# Patient Record
Sex: Female | Born: 1990 | Race: White | Hispanic: No | Marital: Single | Smoking: Never smoker
Health system: Southern US, Community
[De-identification: ages and names within clinical notes are randomized; demographics above are authoritative.]

## PROBLEM LIST (undated history)

## (undated) DIAGNOSIS — B009 Herpesviral infection, unspecified: Secondary | ICD-10-CM

## (undated) DIAGNOSIS — F419 Anxiety disorder, unspecified: Secondary | ICD-10-CM

## (undated) HISTORY — DX: Anxiety disorder, unspecified: F41.9

## (undated) HISTORY — DX: Herpesviral infection, unspecified: B00.9

---

## 2008-10-20 ENCOUNTER — Emergency Department (HOSPITAL_COMMUNITY): Admission: EM | Admit: 2008-10-20 | Discharge: 2008-10-20 | Payer: Self-pay | Admitting: Emergency Medicine

## 2009-07-13 ENCOUNTER — Other Ambulatory Visit: Admission: RE | Admit: 2009-07-13 | Discharge: 2009-07-13 | Payer: Self-pay | Admitting: Gynecology

## 2009-07-13 ENCOUNTER — Ambulatory Visit: Payer: Self-pay | Admitting: Gynecology

## 2010-07-18 ENCOUNTER — Other Ambulatory Visit
Admission: RE | Admit: 2010-07-18 | Discharge: 2010-07-18 | Payer: Self-pay | Source: Home / Self Care | Admitting: Gynecology

## 2010-07-18 ENCOUNTER — Ambulatory Visit: Payer: Self-pay | Admitting: Gynecology

## 2010-11-30 LAB — POCT I-STAT, CHEM 8
Glucose, Bld: 163 mg/dL — ABNORMAL HIGH (ref 70–99)
HCT: 43 % (ref 36.0–46.0)
Hemoglobin: 14.6 g/dL (ref 12.0–15.0)
Potassium: 3.1 mEq/L — ABNORMAL LOW (ref 3.5–5.1)
Sodium: 139 mEq/L (ref 135–145)
TCO2: 23 mmol/L (ref 0–100)

## 2010-11-30 LAB — RAPID URINE DRUG SCREEN, HOSP PERFORMED
Opiates: NOT DETECTED
Tetrahydrocannabinol: NOT DETECTED

## 2010-12-27 ENCOUNTER — Ambulatory Visit (INDEPENDENT_AMBULATORY_CARE_PROVIDER_SITE_OTHER): Payer: BC Managed Care – PPO | Admitting: Gynecology

## 2010-12-27 DIAGNOSIS — B373 Candidiasis of vulva and vagina: Secondary | ICD-10-CM

## 2010-12-27 DIAGNOSIS — N898 Other specified noninflammatory disorders of vagina: Secondary | ICD-10-CM

## 2011-07-03 ENCOUNTER — Telehealth: Payer: Self-pay | Admitting: *Deleted

## 2011-07-03 NOTE — Telephone Encounter (Signed)
As long as pregnancy not possible I would recommend stopping the pills now have a withdrawal period and then restart a new pack this coming Sunday and see how she does with this. If her cramping or bleeding continues she will need an office visit.

## 2011-07-03 NOTE — Telephone Encounter (Signed)
Pt informed with the below note and will try this. If bleeding cont. She will make office visit.

## 2011-07-03 NOTE — Telephone Encounter (Signed)
Pt called stating that her seasonique was doing well until the last 2 weeks. Pt says she has had spotting for two weeks followed by cramping now, she takes pill at the same time everyday, doesn't miss any pills. She does take Advil to help with cramps her period is due to start in the next 2 weeks. She has been taking pills since nov.2011. No chance of pregnancy.  Pt wanted me to relay this message to you. Please advise.

## 2011-07-11 ENCOUNTER — Encounter: Payer: Self-pay | Admitting: *Deleted

## 2011-07-11 DIAGNOSIS — B009 Herpesviral infection, unspecified: Secondary | ICD-10-CM | POA: Insufficient documentation

## 2011-07-20 ENCOUNTER — Encounter: Payer: Self-pay | Admitting: Gynecology

## 2011-07-20 ENCOUNTER — Ambulatory Visit (INDEPENDENT_AMBULATORY_CARE_PROVIDER_SITE_OTHER): Payer: BC Managed Care – PPO | Admitting: Gynecology

## 2011-07-20 VITALS — BP 110/70 | Ht 66.0 in | Wt 145.0 lb

## 2011-07-20 DIAGNOSIS — R823 Hemoglobinuria: Secondary | ICD-10-CM

## 2011-07-20 DIAGNOSIS — Z113 Encounter for screening for infections with a predominantly sexual mode of transmission: Secondary | ICD-10-CM

## 2011-07-20 DIAGNOSIS — Z01419 Encounter for gynecological examination (general) (routine) without abnormal findings: Secondary | ICD-10-CM

## 2011-07-20 DIAGNOSIS — Z131 Encounter for screening for diabetes mellitus: Secondary | ICD-10-CM

## 2011-07-20 DIAGNOSIS — Z13220 Encounter for screening for lipoid disorders: Secondary | ICD-10-CM

## 2011-07-20 DIAGNOSIS — N938 Other specified abnormal uterine and vaginal bleeding: Secondary | ICD-10-CM

## 2011-07-20 LAB — HEPATITIS C ANTIBODY: HCV Ab: NEGATIVE

## 2011-07-20 LAB — HIV ANTIBODY (ROUTINE TESTING W REFLEX): HIV: NONREACTIVE

## 2011-07-20 MED ORDER — NORETHINDRONE ACET-ETHINYL EST 1-20 MG-MCG PO TABS: 1.0000 | ORAL_TABLET | Freq: Every day | ORAL | Status: DC

## 2011-07-20 NOTE — Patient Instructions (Signed)
Recommend Gardasil vaccine series. Follow up for her annual exam in one year

## 2011-07-20 NOTE — Progress Notes (Signed)
Tasha Wagner 1991-02-07 [id number]        20 y.o.  for annual exam.  She does note spotting on and off for the last several weeks which is unusual for her. She is on [employer]. She asked about switching to something different.  Past medical history,surgical history, medications, allergies, family history and social history were all reviewed and documented in the EPIC chart. ROS:  Was performed and pertinent positives and negatives are included in the history.  Exam: chaperone present Filed Vitals:   07/20/11 0904  BP: 110/70   General appearance  Normal Skin grossly normal Head/Neck normal with no cervical or supraclavicular adenopathy thyroid normal Lungs  clear Cardiac RR, without RMG Abdominal  soft, nontender, without masses, organomegaly or hernia Breasts  examined lying and sitting without masses, retractions, discharge or axillary adenopathy. Pelvic  Mons pubis with large mole right side  Ext/BUS/vagina  normal   Cervix  normal  GC Chlamydia screen done  Uterus  anteverted, normal size, shape and contour, midline and mobile nontender   Adnexa  Without masses or tenderness    Anus and perineum  normal     Assessment/Plan:  20 y.o. female for annual exam.    1. DUB on BCPs. Will check qualitative hCG for completeness. She wants to switch to different pills I prescribed Loestrin 120 equivalence and we reviewed every other withdrawal. Op and labeling issues also discussed. Go ahead and try to see how she does back up contraception regardless help decrease STD transmission. [place] is go [place] I wrote a separate prescription for her in the event she would lose her pills to have available as well as I wrote note for the pharmacy to allow her to get six packs at a time to take with her. 2. Mons pubis mole.  Patient has large a mole on her mons pubis that she has always had that has remained unchanged does not bother her she'll continue to watch this. 3. STD  screening. She requests STD screening. She has no known exposures. GC Chlamydia hepatitis B hepatitis C HIV RPR ordered. 4. Gardasil vaccine. She has not received the vaccine and I again recommended that she received the series and she acknowledges my recommendation. 5. Pap smear. Patient has to annual Pap smears and road are normal. A discussed current screening guidelines and recommend an every 3 year Pap smear and she agrees with this. A Pap was done today as her last Pap smear was 2011. 6. Breast health. SBE monthly basis discussed adverse. 7. Health maintenance. CBC urinalysis lipid profile glucose ordered along with her STD blood work and qualitative hCG.    Dara Lords MD, 10:06 AM 07/20/2011

## 2011-07-21 LAB — RPR

## 2011-07-23 ENCOUNTER — Telehealth: Payer: Self-pay

## 2011-07-23 NOTE — Telephone Encounter (Signed)
PT. NOTIFIED OF DR. TF'S NOTE BELOW.

## 2011-07-23 NOTE — Telephone Encounter (Signed)
If irregular bleeding continues through tomorrow, recommend OV

## 2011-07-23 NOTE — Telephone Encounter (Signed)
AFTER SEEING YOU 07-20-11 FOR HER AEX. SHE FINISHED HER OLD OCP'S THIS WEEKEND AND WAS DUE FOR HER REGULAR PERIOD 07-22-11. STATES SHE STARTED BLEEDING FIVE DAYS EARLIER THAN SHE IS SUPPOSED TO. SINCE 07-23-11 PERIOD IS VERY HEAVY WITH CLOTS RANGING FROM SIZE OF DIMES TO CLOSE TO GOLF BALL SIZED CLOTS. NO OTHER SYMPTOMS STATED. I  ADVISED HER TO STAY OFF OF HER FEET UNTIL I RECEIVED AN ANSWER FROM YOU. DOES SHE NEED TO BE RECHECKED THIS P.M.?

## 2011-07-24 ENCOUNTER — Encounter: Payer: Self-pay | Admitting: Gynecology

## 2011-07-24 ENCOUNTER — Ambulatory Visit (INDEPENDENT_AMBULATORY_CARE_PROVIDER_SITE_OTHER): Payer: BC Managed Care – PPO | Admitting: Gynecology

## 2011-07-24 VITALS — BP 120/70

## 2011-07-24 DIAGNOSIS — N938 Other specified abnormal uterine and vaginal bleeding: Secondary | ICD-10-CM

## 2011-07-24 DIAGNOSIS — N949 Unspecified condition associated with female genital organs and menstrual cycle: Secondary | ICD-10-CM

## 2011-07-24 NOTE — Progress Notes (Signed)
Patient presents complaining of heavy vaginal bleeding. She was seen last week for her annual prescription some pills of her Solmon Ice started having breakthrough bleeding finished out her pack and this was her. Week but she has continued to bleed briskly heavier now she is passing clots changing her pad every hour or so. Her hemoglobin was good last week and she a negative hCG.  Exam Abdomen soft nontender without masses guarding rebound Pelvic external BUS vagina with moderate menses flow. Cervix normal. Uterus normal size midline mobile. Adnexa without masses or tenderness  Assessment and plan: Dysfunctional bleeding. I gave her a sample pack of Generess and asked her to start these twice a day for the next several days until her bleeding minimizes and then once a day to finish out the pack. She'll then start her Loestrin 120 equivalents. I did discuss other

## 2011-07-24 NOTE — Patient Instructions (Signed)
Take birth control sample pills Generess twice daily until bleeding is scant then once daily for the remainder of the pack. Then start the prescribed birth control pills as we previously discussed.

## 2011-07-27 ENCOUNTER — Other Ambulatory Visit: Payer: Self-pay | Admitting: Gynecology

## 2012-02-07 ENCOUNTER — Encounter (HOSPITAL_COMMUNITY): Payer: Self-pay | Admitting: Emergency Medicine

## 2012-02-07 ENCOUNTER — Emergency Department (HOSPITAL_COMMUNITY)
Admission: EM | Admit: 2012-02-07 | Discharge: 2012-02-07 | Disposition: A | Discharge status: Home / Self Care | Payer: BC Managed Care – PPO | Attending: Emergency Medicine | Admitting: Emergency Medicine

## 2012-02-07 ENCOUNTER — Emergency Department (HOSPITAL_COMMUNITY): Payer: BC Managed Care – PPO

## 2012-02-07 DIAGNOSIS — S060X9A Concussion with loss of consciousness of unspecified duration, initial encounter: Secondary | ICD-10-CM

## 2012-02-07 DIAGNOSIS — IMO0002 Reserved for concepts with insufficient information to code with codable children: Secondary | ICD-10-CM | POA: Insufficient documentation

## 2012-02-07 DIAGNOSIS — Z833 Family history of diabetes mellitus: Secondary | ICD-10-CM | POA: Insufficient documentation

## 2012-02-07 DIAGNOSIS — S060X0A Concussion without loss of consciousness, initial encounter: Secondary | ICD-10-CM | POA: Insufficient documentation

## 2012-02-07 DIAGNOSIS — Z8249 Family history of ischemic heart disease and other diseases of the circulatory system: Secondary | ICD-10-CM | POA: Insufficient documentation

## 2012-02-07 MED ORDER — NAPROXEN 500 MG PO TABS: 500.0000 mg | ORAL_TABLET | Freq: Two times a day (BID) | ORAL | Status: DC

## 2012-02-07 NOTE — ED Notes (Signed)
Pt denies pain or blurred vision.  Pupils equal.  CT results are back and Dr Hyacinth Meeker is aware.

## 2012-02-07 NOTE — Discharge Instructions (Signed)
Your CT scan was normal, please see the reading instructions regarding concussion. You may use Naprosyn for headaches  Head injury  You have had a head injury which does not appear to require admission at this time. A concussion is a status changed mental ability because of trauma.  Seek immediate medical attention if:   There is confusion or drowsiness  You cannot awaken the injured portion  (Although children frequently become drowsy after injury)  There is nausea or continued, forceful vomiting  You notice dizziness or unsteadiness which is getting worse, or inability to walk  You have convulsions or unconsciousness  You experience a severe, persistent headaches not relieved by Tylenol. (Do not take aspirin as this in pairs clotting abilities). Take other pain medications only as directed  You cannot use arms or legs normally  There are changes in pupil size of the eye  There is clear or bloody discharge from the nose or ears  Change in speech, vision, swallowing or understanding.  Localized weakness, numbness, tingling or change in bowel or bladder control   Please followup with your doctor in the next 2 days if still having symptoms. If you do not have a family doctor, see the list of followup contact information below.  RESOURCE GUIDE  Dental Problems  Patients with Medicaid: [facility] [phone] [address]                                           [phone] [address] Phone:  [phone]                                                  Phone:  [phone]  If unable to pay or uninsured, contact:  [facility] or [facility]. to become qualified for the adult dental clinic.  Chronic Pain Problems Contact [facility]  [phone] Patients need to be referred by their primary care doctor.  Insufficient Money for Medicine Contact [employer]:  call "211" or [facility]  [phone] [phone].  No Primary Care Doctor Call [facility]  [phone] Other agencies that provide inexpensive medical care    [facility]  [phone]    [facility]  [phone]    [facility]  [phone]    [facility]  [phone]    [facility]  [phone]    [facility] Child [facility]  [phone]  Psychological Services [facility] Behavioral Health  [phone] [facility] Services  [phone] [facility] Mental Health   [phone] (emergency services [phone])  Substance Abuse Resources [facility]  [phone] [facility] [phone] [facility] [place] [phone] Floydene Flock [phone] Residential & Outpatient Substance Abuse Program  [phone]  Abuse/Neglect [facility] Child Abuse Hotline 463-104-7881 [facility] Child Abuse Hotline [phone] (After Hours)  Emergency Shelter [facility] [phone]  Maternity Homes [facility] [place] [facility] [place] [phone] [facility] [phone]  MRSA Hotline #:   939-554-0665    [facility]  [place] Dept. [address]      [place] Phone:  [phone]                                   Phone:  [phone]                 Phone:  [place] Phone:  [place] [phone] (After Hours)

## 2012-02-07 NOTE — ED Provider Notes (Signed)
History     CSN: [id number]  Arrival date & time 02/07/12  4098   First MD Initiated Contact with Patient 02/07/12 [id number]      Chief Complaint  Patient presents with  . Blurred Vision    (Consider location/radiation/quality/duration/timing/severity/associated sxs/prior treatment) HPI Comments: 21 year old female with no significant past medical history presents with a complaint of blurred vision. She states that approximately one week ago she was walking backwards and struck the posterior occiput of her head on a trailer on the corner. Ever since that time she has had intermittent blurred vision. The vision is worse in the morning when she awakens, gradually improved throughout the day and is normal most of the time. She was having intermittent headaches until yesterday and has not had any since that time. She denies nausea vomiting fever or stiff neck difficulty breathing, chest pain, weakness numbness or ataxia. Currently she does not have any blurred vision. She does wear contact lenses and glasses.  The history is provided by the patient and a friend.    Past Medical History  Diagnosis Date  . HSV infection     Fever blisters    History reviewed. No pertinent past surgical history.  Family History  Problem Relation Age of Onset  . Diabetes Maternal Grandfather   . Heart disease Paternal Grandmother   . Heart disease Paternal Grandfather     History  Substance Use Topics  . Smoking status: Never Smoker   . Smokeless tobacco: Never Used  . Alcohol Use: 1.0 oz/week    2 drink(s) per week    OB History    Grav Para Term Preterm Abortions TAB SAB Ect Mult Living   0               Review of Systems  All other systems reviewed and are negative.    Allergies  Penicillins  Home Medications   Current Outpatient Rx  Name Route Sig Dispense Refill  . CAMRESE 0.15-0.03 &0.01 MG PO TABS  TAKE AS DIRECTED 91 tablet 2  . ONE-DAILY MULTI VITAMINS PO TABS Oral Take 1  tablet by mouth daily.      Marland Kitchen NAPROXEN 500 MG PO TABS Oral Take 1 tablet (500 mg total) by mouth 2 (two) times daily with a meal. 30 tablet 0    BP 114/83  Pulse 70  Temp 97.8 F (36.6 C) (Oral)  Resp 18  SpO2 97%  LMP 02/07/2012  Physical Exam  Nursing note and vitals reviewed. Constitutional: She appears well-developed and well-nourished. No distress.  HENT:  Head: Normocephalic and atraumatic.  Mouth/Throat: Oropharynx is clear and moist. No oropharyngeal exudate.  Eyes: Conjunctivae and EOM are normal. Pupils are equal, round, and reactive to light. Right eye exhibits no discharge. Left eye exhibits no discharge. No scleral icterus.  Neck: Normal range of motion. Neck supple. No JVD present. No thyromegaly present.  Cardiovascular: Normal rate, regular rhythm, normal heart sounds and intact distal pulses.  Exam reveals no gallop and no friction rub.   No murmur heard. Pulmonary/Chest: Effort normal and breath sounds normal. No respiratory distress. She has no wheezes. She has no rales.  Abdominal: Soft. Bowel sounds are normal. She exhibits no distension and no mass. There is no tenderness.  Musculoskeletal: Normal range of motion. She exhibits no edema and no tenderness.  Lymphadenopathy:    She has no cervical adenopathy.  Neurological: She is alert. Coordination normal.       Neurologic exam:  Speech clear,  pupils equal round reactive to light, extraocular movements intact  Normal peripheral visual fields Cranial nerves III through XII normal including no facial droop Follows commands, moves all extremities x4, normal strength to bilateral upper and lower extremities at all major muscle groups including grip Sensation normal to light touch and pinprick Coordination intact, no limb ataxia, finger-nose-finger normal Rapid alternating movements normal No pronator drift Gait normal   Skin: Skin is warm and dry. No rash noted. No erythema.  Psychiatric: She has a normal  mood and affect. Her behavior is normal.    ED Course  Procedures (including critical care time)   Labs Reviewed  POCT PREGNANCY, URINE   Ct Head Wo Contrast  02/07/2012  *RADIOLOGY REPORT*  Clinical Data: Blurred vision; unequal pupil size.  Hit head last week.  CT HEAD WITHOUT CONTRAST  Technique:  Contiguous axial images were obtained from the base of the skull through the vertex without contrast.  Comparison: None.  Findings: There is no evidence of acute infarction, mass lesion, or intra- or extra-axial hemorrhage on CT.  The posterior fossa, including the cerebellum, brainstem and fourth ventricle, is within normal limits.  The third and lateral ventricles, and basal ganglia are unremarkable in appearance.  The cerebral hemispheres are symmetric in appearance, with normal gray- white differentiation.  No mass effect or midline shift is seen.  There is no evidence of fracture; visualized osseous structures are unremarkable in appearance.  The visualized portions of the orbits are within normal limits.  The paranasal sinuses and mastoid air cells are well-aerated.  No significant soft tissue abnormalities are seen.  IMPRESSION: No evidence of traumatic intracranial injury or fracture.  Original Report Authenticated By: Tonia Ghent, M.D.     1. Concussion       MDM  No signs of head trauma, no hematoma laceration abrasion. She has normal pupillary exam, normal retinal exam and has a normal neurologic exam. Her vital signs are normal, she has persistent increased blurred vision throughout the last several days of there is a temporal pattern to it. I suspect this is a concussion, p[atient insistent on CT scan, we'll order to rule out intracranial hemorrhage.  CT negative, pt informed,  Discharge Prescriptions include:  naprosyn     Vida Roller, MD 02/07/12 [phone]

## 2012-02-07 NOTE — ED Notes (Signed)
PT states hit R occiput last Sat.  Was told by pcp to come to ED if s/s persist.  Pt continued to experience intermittent blurred vision, and intermittent moments when L pupil is slightly smaller than L.  Denies nv, but c/o cervical soreness.  Pupils equal and reactive presently.

## 2012-02-07 NOTE — ED Notes (Signed)
PT. REPORTS HIT HER HEAD AGAINST CAMPER LAST Saturday , STATES LEFT EYE BLURRED VISION " FEELS WEIRD" , HEADACHE AND BACK OF NECK " STIFF", ALSO NOTED LEFT PUPIL SMALLER THAN RIGHT. SEEN BY HER EYE MD YESTERDAY ADVISED TO GO TO ER WHEN SYMPTOMS PERSIST.

## 2012-03-07 ENCOUNTER — Ambulatory Visit (INDEPENDENT_AMBULATORY_CARE_PROVIDER_SITE_OTHER): Payer: BC Managed Care – PPO | Admitting: Emergency Medicine

## 2012-03-07 VITALS — BP 116/76 | HR 74 | Temp 98.7°F | Resp 16 | Ht 66.0 in | Wt 152.2 lb

## 2012-03-07 DIAGNOSIS — A09 Infectious gastroenteritis and colitis, unspecified: Secondary | ICD-10-CM

## 2012-03-07 DIAGNOSIS — K5900 Constipation, unspecified: Secondary | ICD-10-CM

## 2012-03-07 LAB — POCT CBC
Lymph, poc: 2.9 (ref 0.6–3.4)
MCH, POC: 30.8 pg (ref 27–31.2)
MCHC: 30.8 g/dL — AB (ref 31.8–35.4)
MCV: 99.9 fL — AB (ref 80–97)
MID (cbc): 0.6 (ref 0–0.9)
MPV: 9.1 fL (ref 0–99.8)
POC LYMPH PERCENT: 40.7 %L (ref 10–50)
POC MID %: 8.5 %M (ref 0–12)
Platelet Count, POC: 332 10*3/uL (ref 142–424)
RBC: 4.48 M/uL (ref 4.04–5.48)
RDW, POC: 12.8 %
WBC: 7.1 10*3/uL (ref 4.6–10.2)

## 2012-03-07 NOTE — Progress Notes (Signed)
  Subjective:    Patient ID: Tasha Wagner, female    DOB: 1990-12-31, 21 y.o.   MRN: [id number]  HPI Comments: Spent 5 months in [place] and has green mucoid and occasionally blood tinged diarrhea twice weekly.  Tends to be constipated and has not experienced a formed stool since her return/  Diarrhea  This is a recurrent problem. The current episode started more than 1 month ago. The problem occurs less than 2 times per day. The problem has been unchanged. The stool consistency is described as bloody and mucous. The patient states that diarrhea does not awaken her from sleep. Associated symptoms include chills. Pertinent negatives include no abdominal pain, arthralgias, bloating, coughing, fever, headaches, increased  flatus, myalgias, sweats, URI, vomiting or weight loss.      Review of Systems  Constitutional: Positive for chills. Negative for fever and weight loss.  HENT: Negative.   Eyes: Negative.   Respiratory: Negative.  Negative for cough.   Cardiovascular: Negative.   Gastrointestinal: Positive for diarrhea and blood in stool. Negative for nausea, vomiting, abdominal pain, abdominal distention, anal bleeding, rectal pain, bloating and flatus.  Genitourinary: Negative.   Musculoskeletal: Negative for myalgias and arthralgias.  Skin: Negative.   Neurological: Negative for headaches.       Objective:   Physical Exam  Constitutional: She appears well-developed and well-nourished.  HENT:  Head: Normocephalic.  Eyes: Conjunctivae are normal. Pupils are equal, round, and reactive to light.  Neck: Normal range of motion. Neck supple.  Cardiovascular: Normal rate.   Pulmonary/Chest: Effort normal and breath sounds normal.  Abdominal: Soft. There is tenderness.          Assessment & Plan:  Likely infectious diarrhea Cultures, O&P Hydration Follow up after labs

## 2012-03-12 ENCOUNTER — Encounter: Payer: Self-pay | Admitting: Emergency Medicine

## 2012-03-12 LAB — OVA AND PARASITE SCREEN: OP: NONE SEEN

## 2012-03-15 LAB — STOOL CULTURE

## 2012-07-30 ENCOUNTER — Encounter: Payer: Self-pay | Admitting: Gynecology

## 2012-07-30 ENCOUNTER — Ambulatory Visit (INDEPENDENT_AMBULATORY_CARE_PROVIDER_SITE_OTHER): Payer: BC Managed Care – PPO | Admitting: Gynecology

## 2012-07-30 VITALS — BP 120/68 | Ht 66.0 in | Wt 149.0 lb

## 2012-07-30 DIAGNOSIS — Z01419 Encounter for gynecological examination (general) (routine) without abnormal findings: Secondary | ICD-10-CM

## 2012-07-30 DIAGNOSIS — Z309 Encounter for contraceptive management, unspecified: Secondary | ICD-10-CM

## 2012-07-30 MED ORDER — DROSPIRENONE-ETHINYL ESTRADIOL 3-0.02 MG PO TABS: 1.0000 | ORAL_TABLET | Freq: Every day | ORAL | Status: DC

## 2012-07-30 NOTE — Progress Notes (Signed)
Tasha Wagner Dec 06, 1990 [id number]        21 y.o.  G0P0 for annual exam.  Wants to talk about changing her birth control pill  Past medical history,surgical history, medications, allergies, family history and social history were all reviewed and documented in the EPIC chart. ROS:  Was performed and pertinent positives and negatives are included in the history.  Exam: Sherrilyn Rist assistant Filed Vitals:   07/30/12 1608  BP: 120/68  Height: 5\' 6"  (1.676 m)  Weight: 149 lb (67.586 kg)   General appearance  Normal Skin grossly normal Head/Neck normal with no cervical or supraclavicular adenopathy thyroid normal Lungs  clear Cardiac RR, without RMG Abdominal  soft, nontender, without masses, organomegaly or hernia Breasts  examined lying and sitting without masses, retractions, discharge or axillary adenopathy. Pelvic  Ext/BUS/vagina  normal with light menses. Large benign-appearing mole right mons  Cervix  normal with light menses  Uterus  anteverted, normal size, shape and contour, midline and mobile nontender   Adnexa  Without masses or tenderness    Anus and perineum  normal       Assessment/Plan:  21 y.o. G0P0 female for annual exam.   1. Patient notes more moodiness with her BCPs. Wants to change to a different type. We reviewed various options and ultimately she wants to try Yaz. I reviewed possible increased thrombosis risks such as stroke heart attack DVT and she understands and accepts. I prescribed her x1 year we'll see how she does with this. 2. Breast health. SBE monthly reviewed. 3. Mole right mons. Present for years unchanged. Patient will continue to monitor. 4. Pap smear 2011 . No Pap smear done today.  No history of abnormal Pap smears. Plan Pap smear next year a 3 year interval. 5. Gardasil. I again encouraged her to consider the Gardasil series. Patient declined at this time. 6. STD screening. Offered and declined. 7. Health maintenance. Had normal CBC lipid profile  glucose last year. No changes in symptoms area will check urinalysis this year only. Follow up one year, sooner as needed    Dara Lords MD, 4:37 PM 07/30/2012

## 2012-07-30 NOTE — Patient Instructions (Signed)
Trying a birth control pill. Call me if you have any issues.

## 2012-07-31 LAB — URINALYSIS W MICROSCOPIC + REFLEX CULTURE
Bacteria, UA: NONE SEEN
Bilirubin Urine: NEGATIVE
Glucose, UA: NEGATIVE mg/dL
Ketones, ur: NEGATIVE mg/dL
Protein, ur: NEGATIVE mg/dL
RBC / HPF: >50 RBC/hpf — AB (ref <3)
Urobilinogen, UA: 0.2 mg/dL (ref 0.0–1.0)

## 2012-08-03 LAB — URINE CULTURE

## 2012-08-04 ENCOUNTER — Other Ambulatory Visit: Payer: Self-pay | Admitting: *Deleted

## 2012-08-04 DIAGNOSIS — R319 Hematuria, unspecified: Secondary | ICD-10-CM

## 2012-08-08 ENCOUNTER — Other Ambulatory Visit: Payer: BC Managed Care – PPO

## 2012-08-08 DIAGNOSIS — R319 Hematuria, unspecified: Secondary | ICD-10-CM

## 2012-08-09 LAB — URINALYSIS W MICROSCOPIC + REFLEX CULTURE
Casts: NONE SEEN
Glucose, UA: NEGATIVE mg/dL
Hgb urine dipstick: NEGATIVE
Ketones, ur: NEGATIVE mg/dL
Leukocytes, UA: NEGATIVE
Nitrite: NEGATIVE
Protein, ur: NEGATIVE mg/dL
pH: 5.5 (ref 5.0–8.0)

## 2012-08-10 LAB — URINE CULTURE
Colony Count: NO GROWTH
Organism ID, Bacteria: NO GROWTH

## 2012-08-30 ENCOUNTER — Other Ambulatory Visit: Payer: Self-pay | Admitting: Gynecology

## 2012-09-26 ENCOUNTER — Other Ambulatory Visit: Payer: Self-pay | Admitting: *Deleted

## 2012-09-26 MED ORDER — DROSPIRENONE-ETHINYL ESTRADIOL 3-0.02 MG PO TABS: 1.0000 | ORAL_TABLET | Freq: Every day | ORAL | Status: DC

## 2012-09-26 NOTE — Telephone Encounter (Signed)
90 day rx requests

## 2012-11-19 IMAGING — CT CT HEAD W/O CM
1 series · 16 of 30 positions shown, 20 images · non-contrast
Comparison: None.

CLINICAL DATA: Blurred vision; unequal pupil size.  Hit head last
week.

CT HEAD WITHOUT CONTRAST
TECHNIQUE: Contiguous axial images were obtained from the base of
the skull through the vertex without contrast.

[Series 2: head trauma 4.8 h37s · axial · 0.43mm/px · z∈[+1272,+1403]mm · 16 of 30 slices shown, 20 images]
[im 2/30  brain]
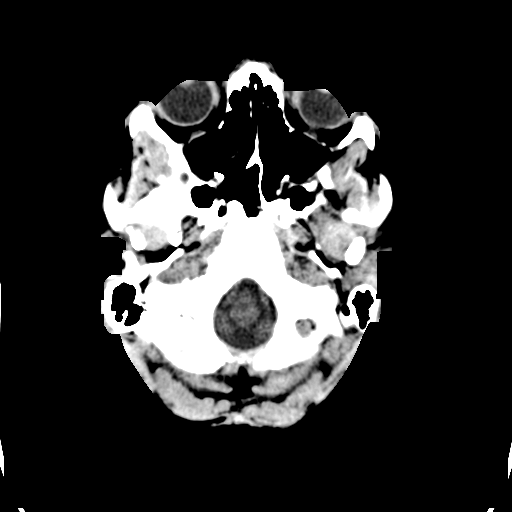
[im 2/30  bone]
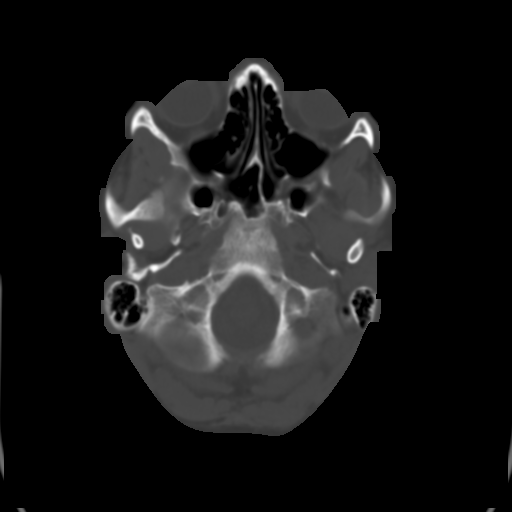
[im 4/30  brain]
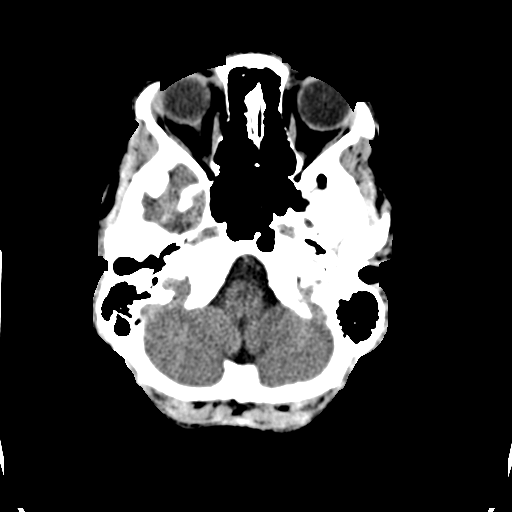
[im 6/30  brain]
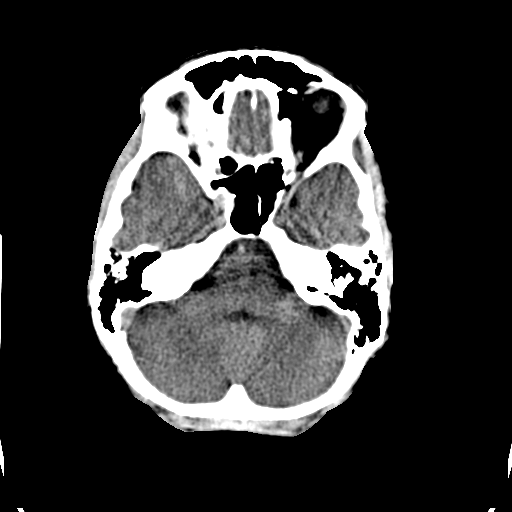
[im 8/30  brain]
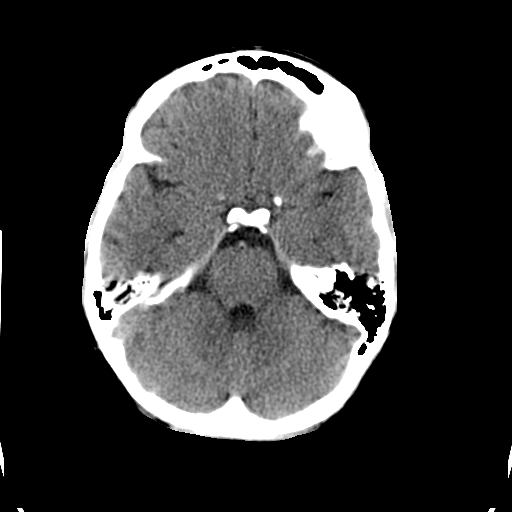
[im 9/30  brain]
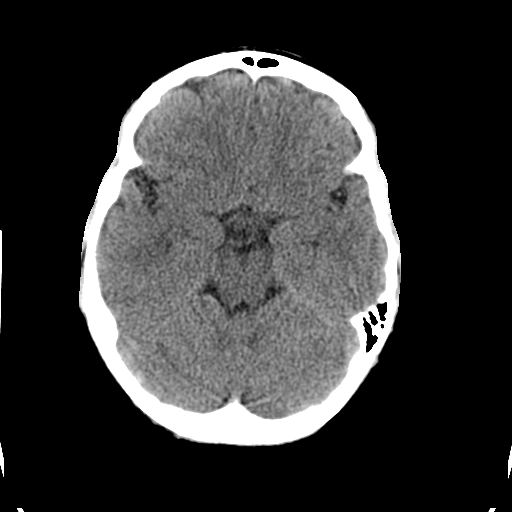
[im 9/30  bone]
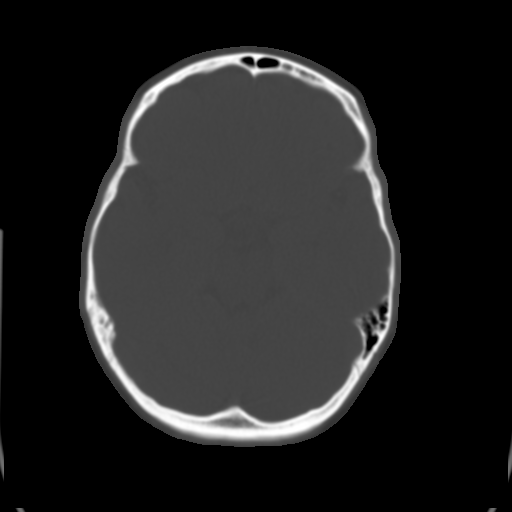
[im 11/30  brain]
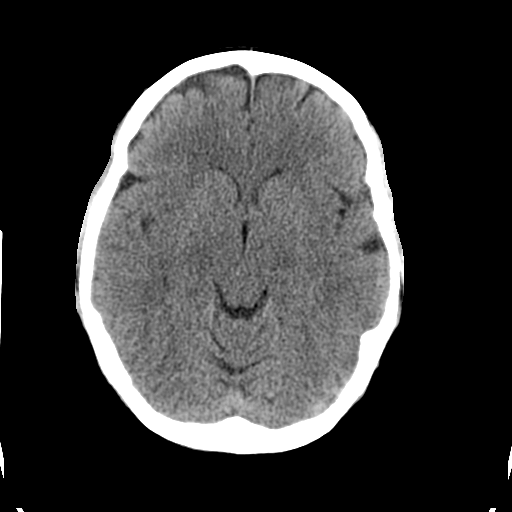
[im 13/30  brain]
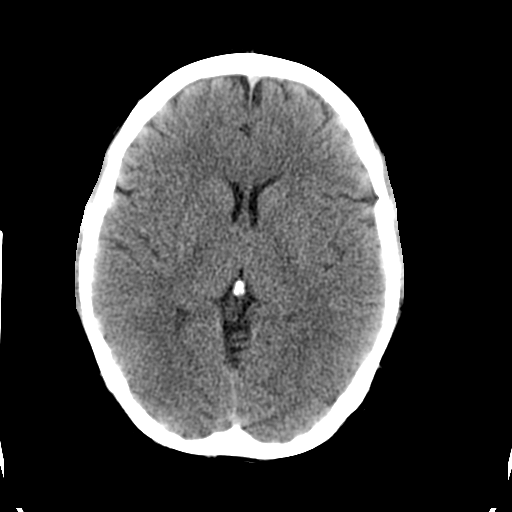
[im 15/30  brain]
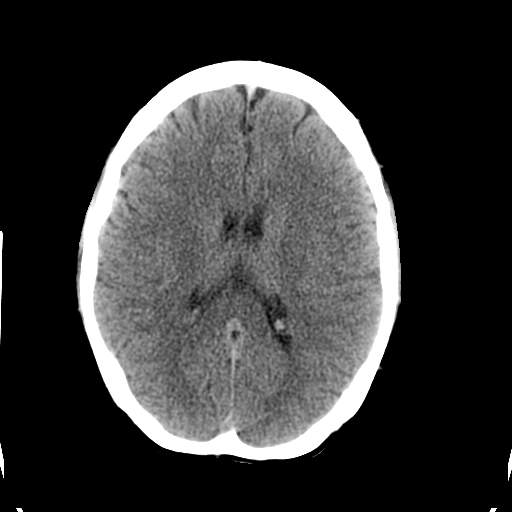
[im 16/30  brain]
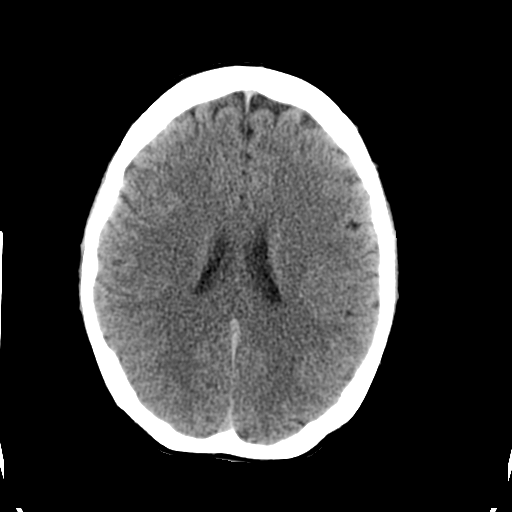
[im 16/30  bone]
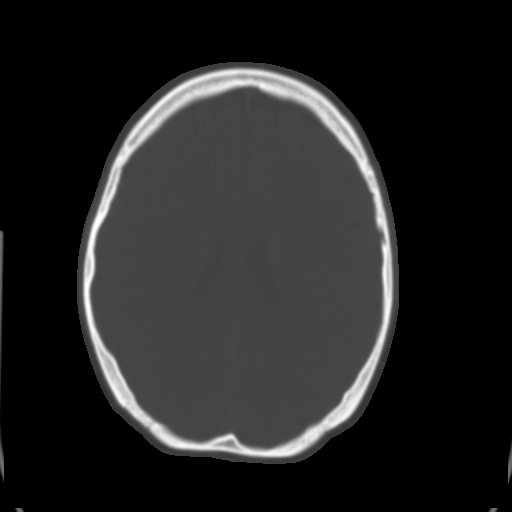
[im 18/30  brain]
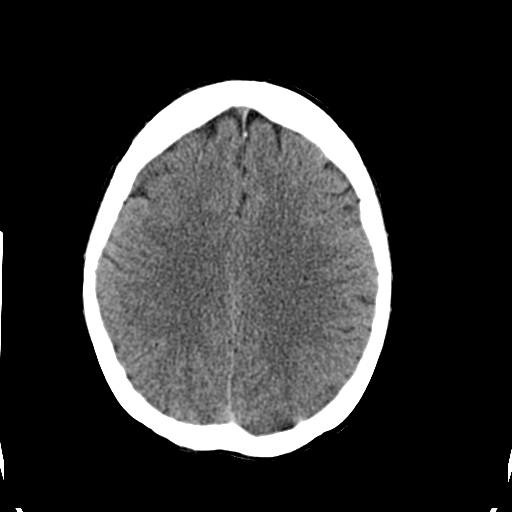
[im 20/30  brain]
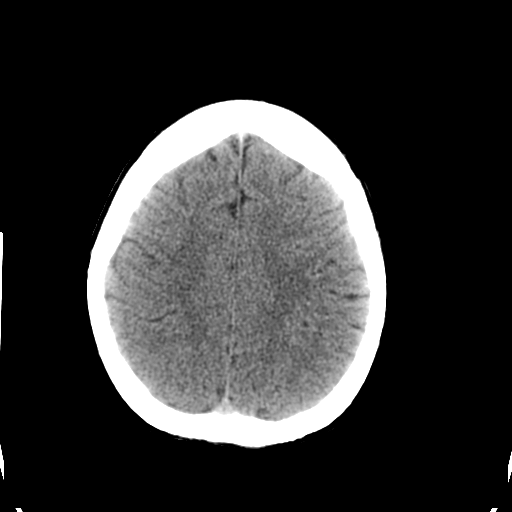
[im 22/30  brain]
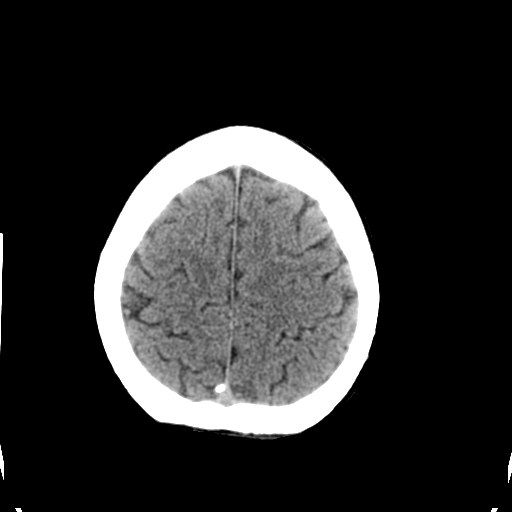
[im 23/30  brain]
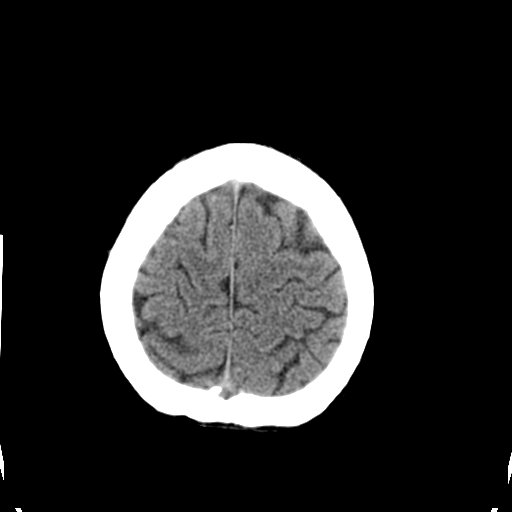
[im 23/30  bone]
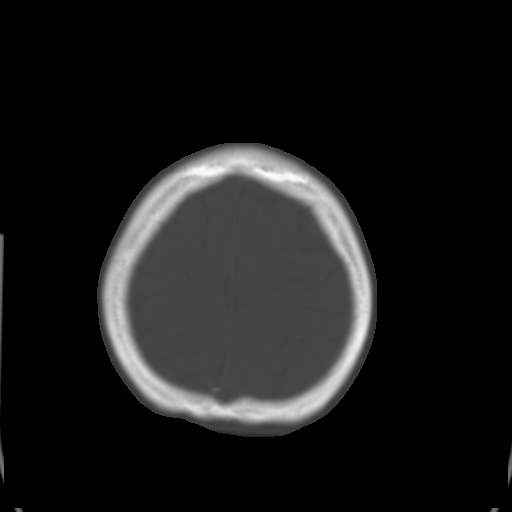
[im 25/30  brain]
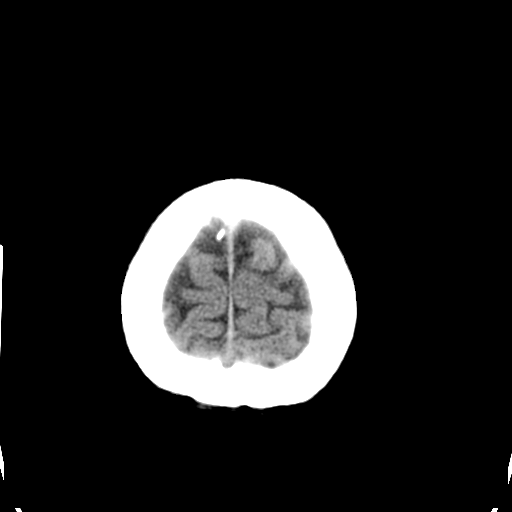
[im 27/30  brain]
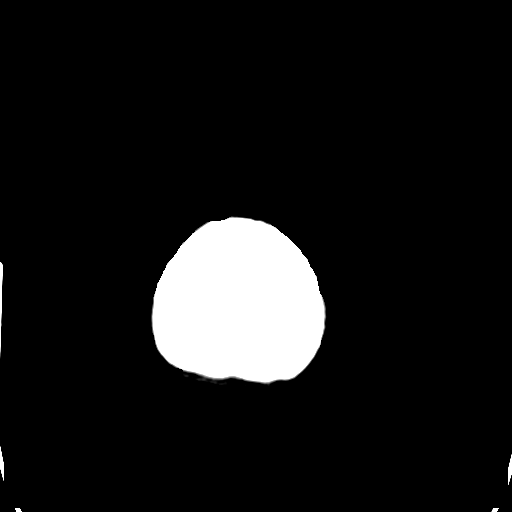
[im 29/30  brain]
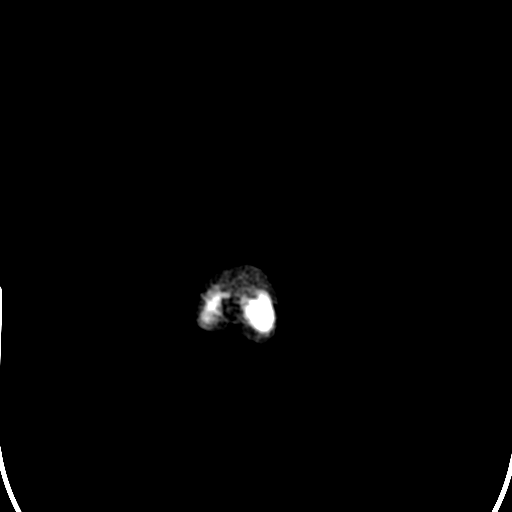

[16 of 30 positions shown; findings below may reference images not displayed]

FINDINGS: There is no evidence of acute infarction, mass lesion, or
intra- or extra-axial hemorrhage on CT.

The posterior fossa, including the cerebellum, brainstem and fourth
ventricle, is within normal limits.  The third and lateral
ventricles, and basal ganglia are unremarkable in appearance.  The
cerebral hemispheres are symmetric in appearance, with normal gray-
white differentiation.  No mass effect or midline shift is seen.

There is no evidence of fracture; visualized osseous structures are
unremarkable in appearance.  The visualized portions of the orbits
are within normal limits.  The paranasal sinuses and mastoid air
cells are well-aerated.  No significant soft tissue abnormalities
are seen.
IMPRESSION: No evidence of traumatic intracranial injury or fracture.

## 2012-12-16 ENCOUNTER — Ambulatory Visit: Payer: BC Managed Care – PPO

## 2012-12-17 ENCOUNTER — Encounter (HOSPITAL_COMMUNITY): Payer: Self-pay

## 2012-12-17 ENCOUNTER — Emergency Department (HOSPITAL_COMMUNITY)
Admission: EM | Admit: 2012-12-17 | Discharge: 2012-12-17 | Disposition: A | Discharge status: Home / Self Care | Payer: BC Managed Care – PPO | Attending: Emergency Medicine | Admitting: Emergency Medicine

## 2012-12-17 DIAGNOSIS — Z880 Allergy status to penicillin: Secondary | ICD-10-CM | POA: Insufficient documentation

## 2012-12-17 DIAGNOSIS — Z3202 Encounter for pregnancy test, result negative: Secondary | ICD-10-CM | POA: Insufficient documentation

## 2012-12-17 DIAGNOSIS — R002 Palpitations: Secondary | ICD-10-CM | POA: Insufficient documentation

## 2012-12-17 DIAGNOSIS — Z8742 Personal history of other diseases of the female genital tract: Secondary | ICD-10-CM | POA: Insufficient documentation

## 2012-12-17 DIAGNOSIS — Z8619 Personal history of other infectious and parasitic diseases: Secondary | ICD-10-CM | POA: Insufficient documentation

## 2012-12-17 DIAGNOSIS — R0789 Other chest pain: Secondary | ICD-10-CM | POA: Insufficient documentation

## 2012-12-17 DIAGNOSIS — R079 Chest pain, unspecified: Secondary | ICD-10-CM

## 2012-12-17 DIAGNOSIS — R42 Dizziness and giddiness: Secondary | ICD-10-CM | POA: Insufficient documentation

## 2012-12-17 DIAGNOSIS — F411 Generalized anxiety disorder: Secondary | ICD-10-CM | POA: Insufficient documentation

## 2012-12-17 DIAGNOSIS — R0602 Shortness of breath: Secondary | ICD-10-CM | POA: Insufficient documentation

## 2012-12-17 LAB — URINALYSIS, ROUTINE W REFLEX MICROSCOPIC
Bilirubin Urine: NEGATIVE
Glucose, UA: NEGATIVE mg/dL
Hgb urine dipstick: NEGATIVE
Ketones, ur: NEGATIVE mg/dL
Leukocytes, UA: NEGATIVE
Protein, ur: NEGATIVE mg/dL
pH: 5.5 (ref 5.0–8.0)

## 2012-12-17 LAB — CBC
HCT: 34.2 % — ABNORMAL LOW (ref 36.0–46.0)
MCH: 32.3 pg (ref 26.0–34.0)
MCV: 89.8 fL (ref 78.0–100.0)
Platelets: 254 10*3/uL (ref 150–400)
RBC: 3.81 MIL/uL — ABNORMAL LOW (ref 3.87–5.11)
WBC: 9.6 10*3/uL (ref 4.0–10.5)

## 2012-12-17 LAB — BASIC METABOLIC PANEL
BUN: 11 mg/dL (ref 6–23)
CO2: 22 mEq/L (ref 19–32)
Calcium: 9.2 mg/dL (ref 8.4–10.5)
Chloride: 103 mEq/L (ref 96–112)
Creatinine, Ser: 0.65 mg/dL (ref 0.50–1.10)
Glucose, Bld: 96 mg/dL (ref 70–99)

## 2012-12-17 MED ORDER — SODIUM CHLORIDE 0.9 % IV BOLUS (SEPSIS)
1000.0000 mL | Freq: Once | INTRAVENOUS | Status: AC
  Administered 2012-12-17: 1000 mL via INTRAVENOUS

## 2012-12-17 NOTE — ED Notes (Signed)
Pt. Reports intermittent dizziness x1 week. C/o left intermittent sharp chest pain with SOB, states "I hurts more when I take a deep breath". Denies N/V. Pt. Alert and oriented x4. Radial pulses strong.

## 2012-12-17 NOTE — ED Notes (Signed)
Pt. C/o recurrent, intermittent dizziness x1 week. Also c/o intermittent sharp chest pain with SOB. Denies N/V, numbness/tingling in extremities. Alert and oriented x4.

## 2012-12-17 NOTE — ED Notes (Signed)
NAD noted at time of d/c home 

## 2012-12-17 NOTE — ED Provider Notes (Signed)
History     CSN: [id number]  Arrival date & time 12/17/12  8295   First MD Initiated Contact with Patient 12/17/12 [phone]      Chief Complaint  Patient presents with  . Chest Pain  . Dizziness    (Consider location/radiation/quality/duration/timing/severity/associated sxs/prior treatment) HPI Tasha Wagner is a 22 y.o. female who presents to ED with complaint of chest pain and dizziness. States symptoms began about a week ago. States having sharp chest pains that come and go, exertional and at random, associated with shortness or breath, dizziness, and palpitations. Denies fever, chills. Denies URI symptoms. Denies cough.  No hx of the same. States sometimes feels anxious. States went to bed with some chest pain, states when woke up in the middle of the night, heart was racing and she was short of breath, so came to ER. States feeling better now. No symptoms at the moment. States no recent long trips, no surgeries, denies pregnancy, denies cardiac hx or history of blood clots. Pt is taking yaz.    Past Medical History  Diagnosis Date  . HSV infection     Fever blisters  . Menstrual cramps     History reviewed. No pertinent past surgical history.  Family History  Problem Relation Age of Onset  . Diabetes Maternal Grandfather   . Heart disease Paternal Grandmother   . Heart disease Paternal Grandfather     History  Substance Use Topics  . Smoking status: Never Smoker   . Smokeless tobacco: Never Used  . Alcohol Use: 1.0 oz/week    2 drink(s) per week    OB History   Grav Para Term Preterm Abortions TAB SAB Ect Mult Living   0               Review of Systems  Constitutional: Negative for fever and chills.  HENT: Negative for neck pain and neck stiffness.   Eyes: Negative for visual disturbance.  Respiratory: Positive for chest tightness and shortness of breath. Negative for cough and wheezing.   Cardiovascular: Positive for chest pain and palpitations. Negative for  leg swelling.  Gastrointestinal: Negative.   Genitourinary: Negative for dysuria and flank pain.  Neurological: Positive for dizziness and light-headedness. Negative for syncope, numbness and headaches.    Allergies  Penicillins  Home Medications   Current Outpatient Rx  Name  Route  Sig  Dispense  Refill  . drospirenone-ethinyl estradiol (YAZ,GIANVI,LORYNA) 3-0.02 MG tablet   Oral   Take 1 tablet by mouth daily.   3 Package   4   . Multiple Vitamin (MULTIVITAMIN) tablet   Oral   Take 1 tablet by mouth daily.             BP 125/81  Pulse 95  Temp(Src) 98.5 F (36.9 C) (Oral)  Resp 18  SpO2 98%  Physical Exam  Nursing note and vitals reviewed. Constitutional: She appears well-developed and well-nourished. No distress.  HENT:  Head: Normocephalic and atraumatic.  Eyes: Conjunctivae are normal.  Neck: Neck supple.  Cardiovascular: Normal rate, regular rhythm and normal heart sounds.   Pulmonary/Chest: Effort normal and breath sounds normal. No respiratory distress. She has no wheezes. She has no rales.  Abdominal: Soft. Bowel sounds are normal. She exhibits no distension. There is no tenderness. There is no rebound.  Musculoskeletal: She exhibits no edema.  No calf pain or tenderness  Neurological: She is alert.  Skin: Skin is warm and dry.    ED Course  Procedures (including critical  care time)   Date: 12/17/2012  Rate: 78  Rhythm: sinus arrhythmia  QRS Axis: normal  Intervals: normal  ST/T Wave abnormalities: normal  Conduction Disutrbances:none  Narrative Interpretation:   Old EKG Reviewed: none available  Results for orders placed during the hospital encounter of 12/17/12  CBC      Result Value Range   WBC 9.6  4.0 - 10.5 K/uL   RBC 3.81 (*) 3.87 - 5.11 MIL/uL   Hemoglobin 12.3  12.0 - 15.0 g/dL   HCT 13.0 (*) 86.5 - 78.4 %   MCV 89.8  78.0 - 100.0 fL   MCH 32.3  26.0 - 34.0 pg   MCHC 36.0  30.0 - 36.0 g/dL   RDW 69.6  29.5 - 28.4 %    Platelets 254  150 - 400 K/uL  BASIC METABOLIC PANEL      Result Value Range   Sodium 135  135 - 145 mEq/L   Potassium 3.6  3.5 - 5.1 mEq/L   Chloride 103  96 - 112 mEq/L   CO2 22  19 - 32 mEq/L   Glucose, Bld 96  70 - 99 mg/dL   BUN 11  6 - 23 mg/dL   Creatinine, Ser 1.32  0.50 - 1.10 mg/dL   Calcium 9.2  8.4 - 44.0 mg/dL   GFR calc non Af Amer >90  >90 mL/min   GFR calc Af Amer >90  >90 mL/min  URINALYSIS, ROUTINE W REFLEX MICROSCOPIC      Result Value Range   Color, Urine STRAW (*) YELLOW   APPearance CLEAR  CLEAR   Specific Gravity, Urine 1.005  1.005 - 1.030   pH 5.5  5.0 - 8.0   Glucose, UA NEGATIVE  NEGATIVE mg/dL   Hgb urine dipstick NEGATIVE  NEGATIVE   Bilirubin Urine NEGATIVE  NEGATIVE   Ketones, ur NEGATIVE  NEGATIVE mg/dL   Protein, ur NEGATIVE  NEGATIVE mg/dL   Urobilinogen, UA 0.2  0.0 - 1.0 mg/dL   Nitrite NEGATIVE  NEGATIVE   Leukocytes, UA NEGATIVE  NEGATIVE  PREGNANCY, URINE      Result Value Range   Preg Test, Ur NEGATIVE  NEGATIVE  D-DIMER, QUANTITATIVE      Result Value Range   D-Dimer, Quant 0.31  0.00 - 0.48 ug/mL-FEU   No results found.    1. Chest pain   2. Shortness of breath       MDM  Pt with non specific, atypical chest pain, dizziness, palpitations. Admits to possibility of panic attacks, because states feels anxious when it happens. Here in ED, ECG normal, labs unremarkable, d dimer negative. Pt symptom free in ED. She was watched for 3 hrs on the monitor with no arrhythmias noted. Discussed results with pt. Possibility of arrhythmia vs anxiety. Will need outpatient follow up. Pt agreed with the plan.   Filed Vitals:   12/17/12 0250 12/17/12 0401 12/17/12 0402 12/17/12 0402  BP: 113/74 102/68 116/83 125/81  Pulse: 87 79 81 95  Temp: 98.5 F (36.9 C)     TempSrc: Oral     Resp: 18     SpO2: 98%              Analeah Brame A Abrar Bilton, PA-C 12/17/12 1458

## 2012-12-26 NOTE — ED Provider Notes (Signed)
Medical screening examination/treatment/procedure(s) were performed by non-physician practitioner and as supervising physician I was immediately available for consultation/collaboration.   Brandt Loosen, MD 12/26/12 [phone]

## 2013-01-05 ENCOUNTER — Ambulatory Visit (INDEPENDENT_AMBULATORY_CARE_PROVIDER_SITE_OTHER): Payer: BC Managed Care – PPO | Admitting: Internal Medicine

## 2013-01-05 ENCOUNTER — Ambulatory Visit: Payer: BC Managed Care – PPO

## 2013-01-05 VITALS — BP 88/68 | HR 79 | Temp 98.4°F | Resp 18 | Wt 151.0 lb

## 2013-01-05 DIAGNOSIS — R42 Dizziness and giddiness: Secondary | ICD-10-CM

## 2013-01-05 DIAGNOSIS — L301 Dyshidrosis [pompholyx]: Secondary | ICD-10-CM

## 2013-01-05 DIAGNOSIS — R079 Chest pain, unspecified: Secondary | ICD-10-CM

## 2013-01-05 DIAGNOSIS — R5383 Other fatigue: Secondary | ICD-10-CM

## 2013-01-05 DIAGNOSIS — K219 Gastro-esophageal reflux disease without esophagitis: Secondary | ICD-10-CM

## 2013-01-05 DIAGNOSIS — R5381 Other malaise: Secondary | ICD-10-CM

## 2013-01-05 LAB — POCT CBC
Granulocyte percent: 59.3 %G (ref 37–80)
HCT, POC: 39.4 % (ref 37.7–47.9)
Lymph, poc: 2.6 (ref 0.6–3.4)
MCH, POC: 31.5 pg — AB (ref 27–31.2)
MCV: 97.7 fL — AB (ref 80–97)
MID (cbc): 0.6 (ref 0–0.9)
POC LYMPH PERCENT: 33.2 %L (ref 10–50)
Platelet Count, POC: 256 10*3/uL (ref 142–424)
RDW, POC: 12.7 %
WBC: 7.9 10*3/uL (ref 4.6–10.2)

## 2013-01-05 LAB — T4, FREE: Free T4: 0.99 ng/dL (ref 0.80–1.80)

## 2013-01-05 LAB — LIPID PANEL
Cholesterol: 171 mg/dL (ref 0–200)
HDL: 47 mg/dL (ref >39)
Triglycerides: 268 mg/dL — ABNORMAL HIGH (ref <150)

## 2013-01-05 LAB — COMPREHENSIVE METABOLIC PANEL
Albumin: 4 g/dL (ref 3.5–5.2)
BUN: 9 mg/dL (ref 6–23)
CO2: 24 mEq/L (ref 19–32)
Glucose, Bld: 92 mg/dL (ref 70–99)
Sodium: 136 mEq/L (ref 135–145)
Total Bilirubin: 0.6 mg/dL (ref 0.3–1.2)
Total Protein: 7 g/dL (ref 6.0–8.3)

## 2013-01-05 LAB — TSH: TSH: 3.075 u[IU]/mL (ref 0.350–4.500)

## 2013-01-05 MED ORDER — OMEPRAZOLE 40 MG PO CPDR: 40.0000 mg | DELAYED_RELEASE_CAPSULE | Freq: Every day | ORAL | Status: DC

## 2013-01-05 MED ORDER — FLUOCINONIDE-E 0.05 % EX CREA: TOPICAL_CREAM | Freq: Two times a day (BID) | CUTANEOUS | Status: DC

## 2013-01-05 NOTE — Progress Notes (Signed)
Subjective:    Patient ID: Tasha Wagner, female    DOB: Mar 11, 1991, 22 y.o.   MRN: [id number]  HPI problems over the past 8 weeks in with chest pain that occurs after 3-4 minutes of running. This does not happen every time. It is not necessarily associated with shortness of breath or palpitations. She has had several episodes of waking at night with palpitations but no fever chills night sweats. She does not have cough or history of asthma. She has never had a heart problem. She has had episodes when sitting around her she would feel dizzy or lightheaded but not necessarily associated with palpitations. 2 weeks ago had significant dizziness with blurred vision on 3 or 4 occasions at work, but this has resolved. Chest pain can occur with bending over. Occasionally with lifting. see recent emergency room visit for chest pain/cause on birth control pills d-dimer and EKG were done which were negative  Past medical history stable No risk behaviors   Review of Systems No fever chills night sweats and weight loss She has noticed generalized fatigue over the last 3-4 months with an inability to lose weight after gaining 20 pounds in the last year Denies cold or heat intolerance One uncle with hypothyroidism No allergic rhinitis/no sore throat or dysphagia History of hiatal hernia with GERD Proven by endoscopy/on no medicines and having more trouble recently No diarrhea or constipation No bone and joint abnormalities Has had a skin rash on her hands which is pruritic on and off for the last few months No headaches or syncope   no anxiety or depression  Objective:   Physical Exam BP 88/68  Pulse 79  Temp(Src) 98.4 F (36.9 C) (Oral)  Resp 18  Wt 151 lb (68.493 kg)  BMI 24.38 kg/m2  LMP 12/15/2012 No acute distress HEENT clear No thyromegaly or lymphadenopathy Heart regular without murmur click rub or gallop  Mild tenderness in the anterior chest at the costosternal junctions Lungs clear  to auscultation Abdomen soft nontender nondistended with no organomegaly or masses  Extremities clear  Pinpoint eczema changes on hands and web spaces Cranial nerves II through XII intact no sensory or motor losses   cerebellar intact   gait normal      [facility] reading (PRIMARY) by  Dr. Biance Moncrief=NAD  Results for orders placed in visit on 01/05/13  POCT CBC      Result Value Range   WBC 7.9  4.6 - 10.2 K/uL   Lymph, poc 2.6  0.6 - 3.4   POC LYMPH PERCENT 33.2  10 - 50 %L   MID (cbc) 0.6  0 - 0.9   POC MID % 7.5  0 - 12 %M   POC Granulocyte 4.7  2 - 6.9   Granulocyte percent 59.3  37 - 80 %G   RBC 4.03 (*) 4.04 - 5.48 M/uL   Hemoglobin 12.7  12.2 - 16.2 g/dL   HCT, POC 40.9  81.1 - 47.9 %   MCV 97.7 (*) 80 - 97 fL   MCH, POC 31.5 (*) 27 - 31.2 pg   MCHC 32.2  31.8 - 35.4 g/dL   RDW, POC 91.4     Platelet Count, POC 256  142 - 424 K/uL   MPV 8.5  0 - 99.8 fL     assessment and plan Problem #1 chest pain  Musculoskeletal Problem #2 palpitations at night unclear etiology Problem #3 lightheadedness and fatigue Problem #4 GERD from hiatal hernia Problem #5 dyshidrotic eczema  Meds ordered this encounter  Medications  . omeprazole (PRILOSEC) 40 MG capsule    Sig: Take 1 capsule (40 mg total) by mouth daily.    Dispense:  30 capsule    Refill:  3  . fluocinonide-emollient (LIDEX-E) 0.05 % cream    Sig: Apply topically 2 (two) times daily.    Dispense:  30 g    Refill:  1   nsaids for chest pain and/stretch and exercise  Notify labs

## 2013-01-06 ENCOUNTER — Encounter: Payer: Self-pay | Admitting: Internal Medicine

## 2013-01-19 ENCOUNTER — Telehealth: Payer: Self-pay | Admitting: *Deleted

## 2013-01-19 MED ORDER — NORGESTIM-ETH ESTRAD TRIPHASIC 0.18/0.215/0.25 MG-25 MCG PO TABS: 1.0000 | ORAL_TABLET | Freq: Every day | ORAL | Status: AC

## 2013-01-19 NOTE — Telephone Encounter (Signed)
Pt has been on generic Yaz since Dec. C/o increased menstrual cramps with it. In Dec she contemplated with Dr Glenetta Hew to go back on Ortho Tri Cyclen Lo. Will change this month back to ORtho Tri Cyclen lo. KW

## 2013-01-20 ENCOUNTER — Telehealth: Payer: Self-pay | Admitting: *Deleted

## 2013-01-20 NOTE — Telephone Encounter (Signed)
Telephone encounter 01/19/13.  "Pt has been on generic Yaz since Dec. C/o increased menstrual cramps with it. In Dec she contemplated with Dr Glenetta Hew to go back on Ortho Tri Cyclen Lo. Will change this month back to ORtho Tri Cyclen lo. KW  Pt said the ortho tri cyclen lo is too expensive and if she could have something similar to the medication. Please advise

## 2013-01-20 NOTE — Telephone Encounter (Signed)
Trial of TriSprintec generic would be okay

## 2013-01-21 MED ORDER — NORGESTIMATE-ETH ESTRADIOL 0.25-35 MG-MCG PO TABS: 1.0000 | ORAL_TABLET | Freq: Every day | ORAL | Status: DC

## 2013-01-21 NOTE — Telephone Encounter (Signed)
rx sent, pt informed.  

## 2013-07-30 ENCOUNTER — Other Ambulatory Visit: Payer: Self-pay | Admitting: Gynecology

## 2013-08-27 ENCOUNTER — Other Ambulatory Visit: Payer: Self-pay | Admitting: Gynecology

## 2013-09-03 ENCOUNTER — Encounter: Payer: BC Managed Care – PPO | Admitting: Gynecology

## 2013-09-25 ENCOUNTER — Ambulatory Visit (INDEPENDENT_AMBULATORY_CARE_PROVIDER_SITE_OTHER): Payer: BC Managed Care – PPO | Admitting: Gynecology

## 2013-09-25 ENCOUNTER — Encounter: Payer: Self-pay | Admitting: Gynecology

## 2013-09-25 ENCOUNTER — Other Ambulatory Visit (HOSPITAL_COMMUNITY)
Admission: RE | Admit: 2013-09-25 | Discharge: 2013-09-25 | Disposition: A | Discharge status: Home / Self Care | Payer: BC Managed Care – PPO | Source: Ambulatory Visit | Attending: Gynecology | Admitting: Gynecology

## 2013-09-25 VITALS — BP 110/60 | Ht 67.0 in | Wt 151.0 lb

## 2013-09-25 DIAGNOSIS — Z01419 Encounter for gynecological examination (general) (routine) without abnormal findings: Secondary | ICD-10-CM | POA: Insufficient documentation

## 2013-09-25 DIAGNOSIS — L293 Anogenital pruritus, unspecified: Secondary | ICD-10-CM

## 2013-09-25 DIAGNOSIS — L292 Pruritus vulvae: Secondary | ICD-10-CM

## 2013-09-25 DIAGNOSIS — Z113 Encounter for screening for infections with a predominantly sexual mode of transmission: Secondary | ICD-10-CM

## 2013-09-25 LAB — WET PREP FOR TRICH, YEAST, CLUE: TRICH WET PREP: NONE SEEN

## 2013-09-25 LAB — RPR

## 2013-09-25 MED ORDER — FLUCONAZOLE 150 MG PO TABS: 150.0000 mg | ORAL_TABLET | Freq: Once | ORAL | Status: AC

## 2013-09-25 MED ORDER — METRONIDAZOLE 500 MG PO TABS: 500.0000 mg | ORAL_TABLET | Freq: Two times a day (BID) | ORAL | Status: AC

## 2013-09-25 MED ORDER — NORGESTIMATE-ETH ESTRADIOL 0.25-35 MG-MCG PO TABS: ORAL_TABLET | ORAL | Status: DC

## 2013-09-25 NOTE — Addendum Note (Signed)
Addended by: Dayna BarkerGARDNER, Corretta Munce K on: 09/25/2013 12:04 PM   Modules accepted: Orders

## 2013-09-25 NOTE — Patient Instructions (Signed)
Take Diflucan pill once. Take Flagyl medication twice daily for 7 days. Avoid alcohol while taking. Followup if you're vaginal symptoms persist, worsen or recur. Followup in one year for annual exam.

## 2013-09-25 NOTE — Progress Notes (Signed)
Glynn OctaveKelsey Fosberg 06/24/1991 [id number]        23 y.o.  G0P0 for annual exam.  Doing well. Several issues noted below.  Past medical history,surgical history, problem list, medications, allergies, family history and social history were all reviewed and documented in the EPIC chart.  ROS:  Performed and pertinent positives and negatives are included in the history, assessment and plan .  Exam: Kim assistant Filed Vitals:   09/25/13 1034  BP: 110/60  Height: 5\' 7"  (1.702 m)  Weight: 151 lb (68.493 kg)   General appearance  Normal Skin grossly normal Head/Neck normal with no cervical or supraclavicular adenopathy thyroid normal Lungs  clear Cardiac RR, without RMG Abdominal  soft, nontender, without masses, organomegaly or hernia Breasts  examined lying and sitting without masses, retractions, discharge or axillary adenopathy. Pelvic  Ext/BUS/vagina  large raised mole right upper mons pubis. Normal with normal appearing discharge  Cervix  Normal. Pap, GC/Chlamydia done  Uterus  anteverted, normal size, shape and contour, midline and mobile nontender   Adnexa  Without masses or tenderness    Anus and perineum  Normal      Assessment/Plan:  23 y.o. G0P0 female for annual exam regular menses, oral contraceptives.   1. Birth control. Patient doing well on her Sprintec and wants to continue. Refill x1 year. 2. Vulvar itching.  Notices once or so each month period lasts several days and then resolves on its own. Slight discharge with it. No odor. Does not seem time to her menses or intercourse. Has had yeast in the past but did not feel that it was yeast. Wet prep does show yeast and bacterial vaginosis. Will treat with Diflucan 150 mg x1 dose and Flagyl 500 mg twice a day x7 days, alcohol avoidance discussed. 3. Mole on mons pubis. Present for years unchanged. Does not bother the patient. We'll continue with observation and report any changes. 4. Pap smear 2011. Pap smear done today. No  history of abnormal Pap smears previously. Assuming normal plan 3 year repeat interval per current screening guidelines. 5. STD screening. No known exposure but wants screened. GC/Chlamydia, HIV, RPR, hepatitis B and C. done. Condoms regardless of BCPs to help decrease STD risk. 6. Gardasil series again discussed offered and declined. 7. Breast health. SBE monthly reviewed. 8. Health maintenance. Patient has routine blood work done through Dr. Netta Corriganoolittle's office. Followup one year, sooner as needed.   Note: This document was prepared with digital dictation and possible smart phrase technology. Any transcriptional errors that result from this process are unintentional.   Dara LordsFONTAINE,Niya Behler P MD, 10:55 AM 09/25/2013

## 2013-09-26 LAB — URINALYSIS W MICROSCOPIC + REFLEX CULTURE
BACTERIA UA: NONE SEEN
BILIRUBIN URINE: NEGATIVE
CASTS: NONE SEEN
Crystals: NONE SEEN
GLUCOSE, UA: NEGATIVE mg/dL
HGB URINE DIPSTICK: NEGATIVE
KETONES UR: NEGATIVE mg/dL
Leukocytes, UA: NEGATIVE
Nitrite: NEGATIVE
PROTEIN: NEGATIVE mg/dL
Specific Gravity, Urine: 1.007 (ref 1.005–1.030)
Squamous Epithelial / LPF: NONE SEEN
Urobilinogen, UA: 0.2 mg/dL (ref 0.0–1.0)
pH: 6 (ref 5.0–8.0)

## 2013-09-26 LAB — HIV ANTIBODY (ROUTINE TESTING W REFLEX): HIV: NONREACTIVE

## 2013-09-26 LAB — HEPATITIS B SURFACE ANTIGEN: Hepatitis B Surface Ag: NEGATIVE

## 2013-09-26 LAB — GC/CHLAMYDIA PROBE AMP
CT Probe RNA: NEGATIVE
GC Probe RNA: NEGATIVE

## 2013-09-26 LAB — HEPATITIS C ANTIBODY: HCV AB: NEGATIVE

## 2013-10-18 IMAGING — CR DG CHEST 2V
2 series · 2 of 2 positions shown · non-contrast
Comparison: None.

CLINICAL DATA: Chest pain

CHEST - 2 VIEW

[PA]
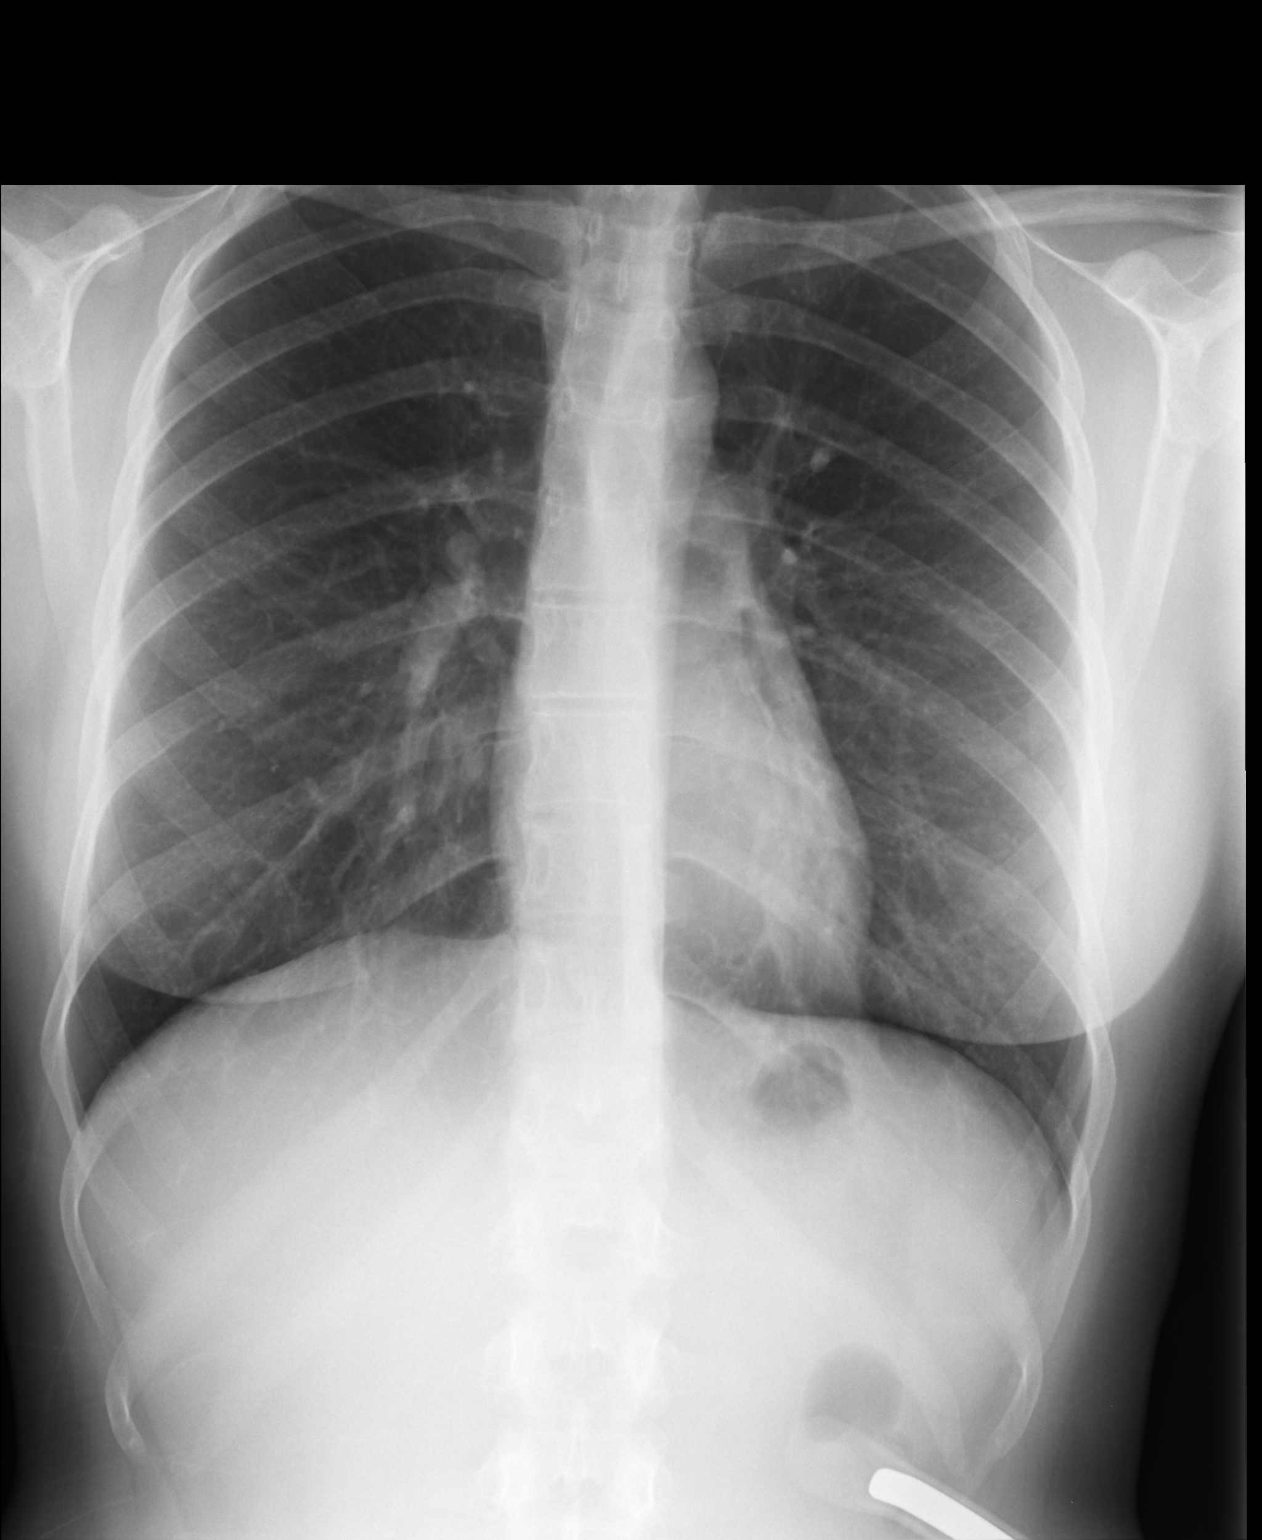

[lateral]
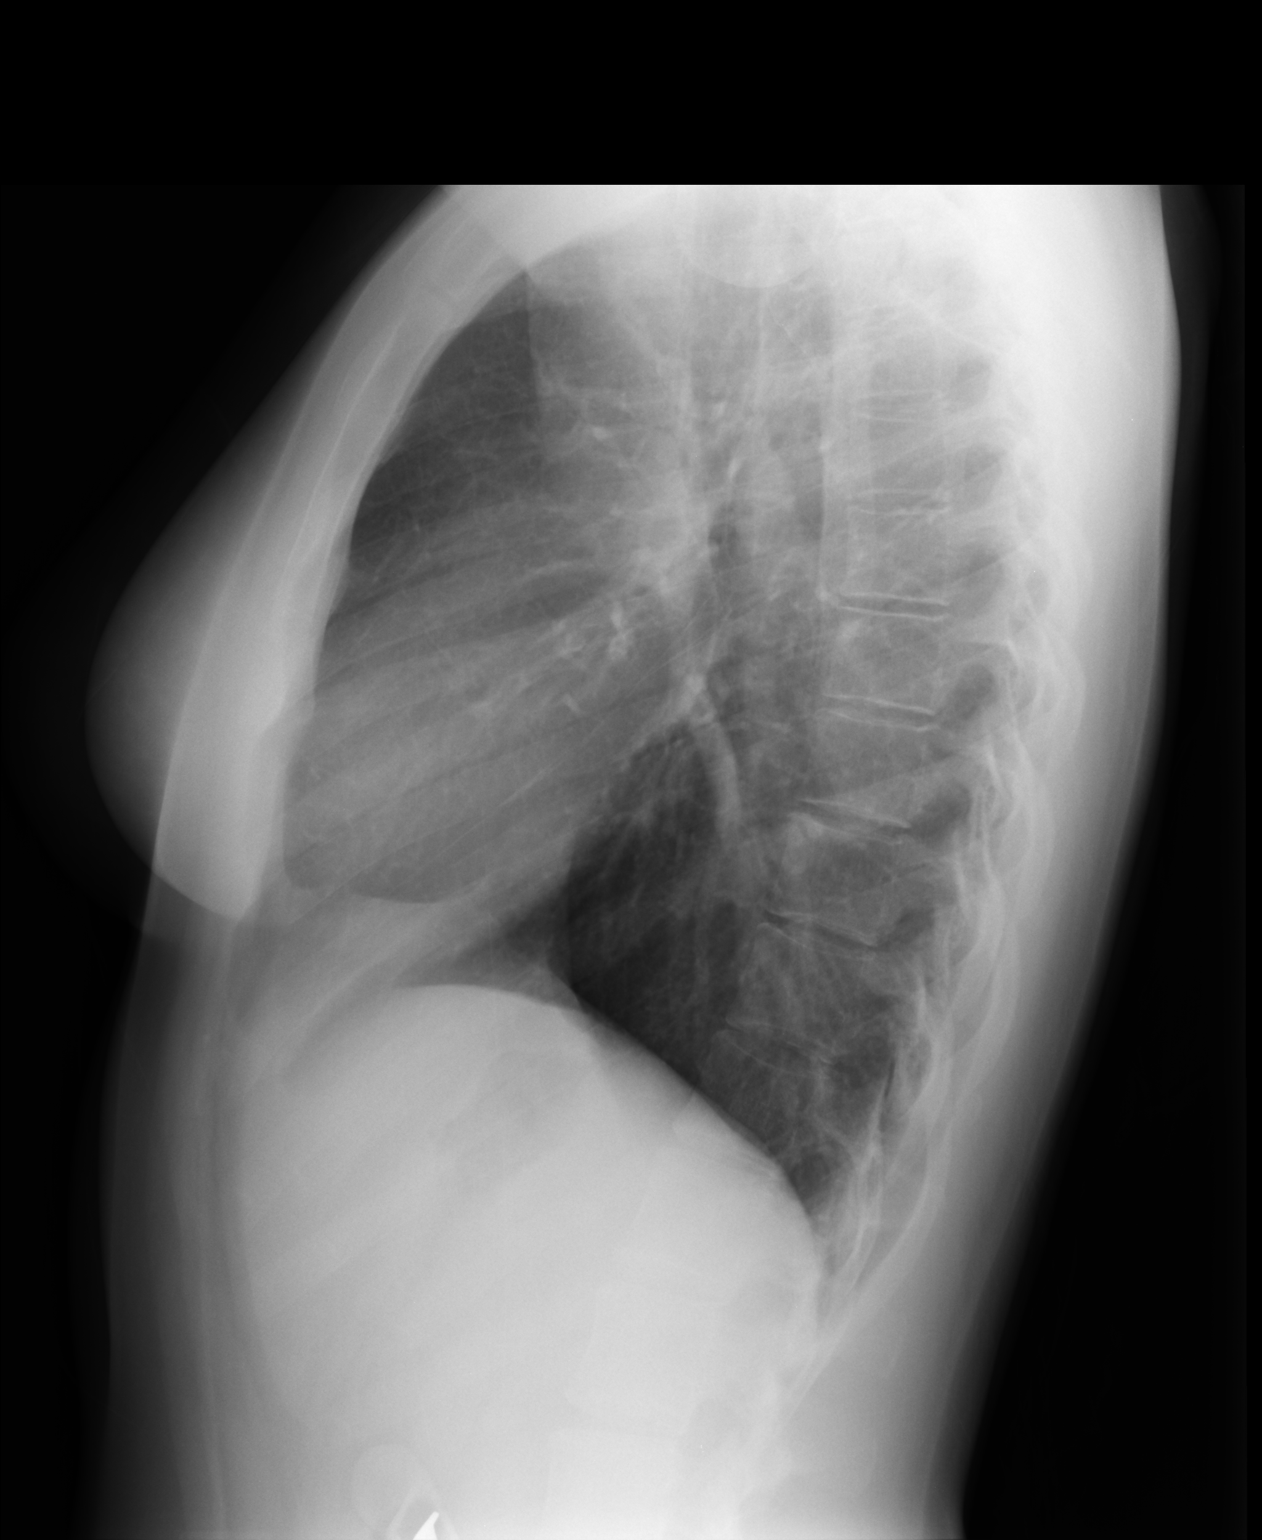

[2 of 2 positions shown; findings below may reference images not displayed]

FINDINGS: The lungs are clear without focal consolidation, edema,
effusion or pneumothorax.  Cardiopericardial silhouette is within
normal limits for size.  Imaged bony structures of the thorax are
intact.
IMPRESSION: Normal exam.

## 2014-08-16 ENCOUNTER — Other Ambulatory Visit: Payer: Self-pay | Admitting: Gynecology

## 2014-11-18 ENCOUNTER — Other Ambulatory Visit: Payer: Self-pay | Admitting: Gynecology

## 2014-12-13 ENCOUNTER — Telehealth: Payer: Self-pay | Admitting: *Deleted

## 2014-12-13 MED ORDER — NORGESTIMATE-ETH ESTRADIOL 0.25-35 MG-MCG PO TABS: 1.0000 | ORAL_TABLET | Freq: Every day | ORAL | Status: AC

## 2014-12-13 NOTE — Telephone Encounter (Signed)
[place] refill on birth control 2 pack for pt to have. I explained to patient that she will need to establish care with GYN md to received additional refills.

## 2014-12-16 ENCOUNTER — Other Ambulatory Visit: Payer: Self-pay | Admitting: Gynecology

## 2015-07-18 ENCOUNTER — Encounter: Payer: Self-pay | Admitting: Internal Medicine
